# Patient Record
Sex: Male | Born: 1975 | Race: Black or African American | Hispanic: No | Marital: Single | Smoking: Never smoker
Health system: Southern US, Community
[De-identification: ages and names within clinical notes are randomized; demographics above are authoritative.]

## PROBLEM LIST (undated history)

## (undated) DIAGNOSIS — I10 Essential (primary) hypertension: Secondary | ICD-10-CM

---

## 2005-07-14 ENCOUNTER — Ambulatory Visit: Payer: Self-pay | Admitting: Internal Medicine

## 2005-07-14 ENCOUNTER — Inpatient Hospital Stay (HOSPITAL_COMMUNITY): Admission: EM | Admit: 2005-07-14 | Discharge: 2005-07-18 | Payer: Self-pay | Admitting: Emergency Medicine

## 2005-07-16 ENCOUNTER — Ambulatory Visit: Payer: Self-pay | Admitting: Internal Medicine

## 2005-08-24 ENCOUNTER — Encounter: Admission: RE | Admit: 2005-08-24 | Discharge: 2005-08-24 | Payer: Self-pay | Admitting: General Surgery

## 2006-10-19 IMAGING — CT CT PELVIS W/ CM
1 of 3 series · 14 of 32 positions shown, 19 images · IV contrast (omnipaque)
Comparison: none

CLINICAL DATA: Abdominal pain. 
 ABDOMEN CT WITH CONTRAST:
TECHNIQUE: Multidetector CT imaging of the abdomen was performed following the standard protocol during bolus administration of intravenous contrast.
 Contrast:  100 cc Omnipaque 300.
TECHNIQUE: Multidetector CT imaging of the pelvis was performed following the standard protocol during bolus administration of intravenous contrast.

[Series 4: abd/pelv with 5.0 b30f st · axial · 0.64mm/px · z∈[-471,-101]mm · 14 of 84 slices shown, 19 images]
[im 5/84  soft-tissue]
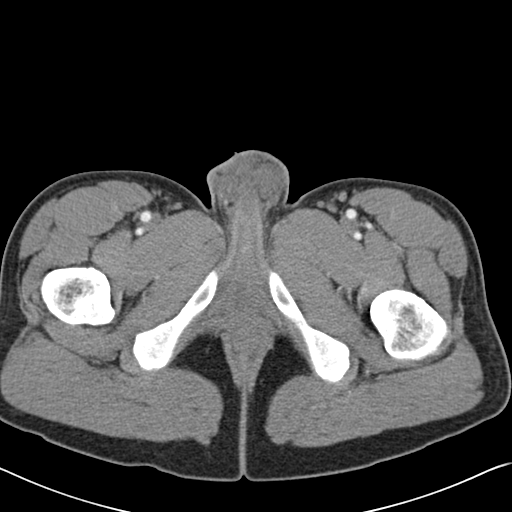
[im 5/84  bone]
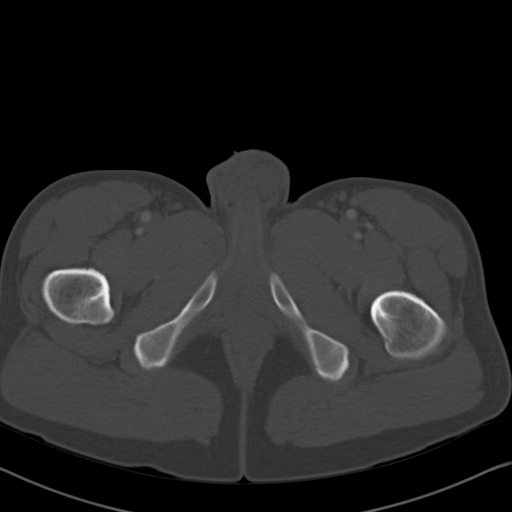
[im 14/84  soft-tissue]
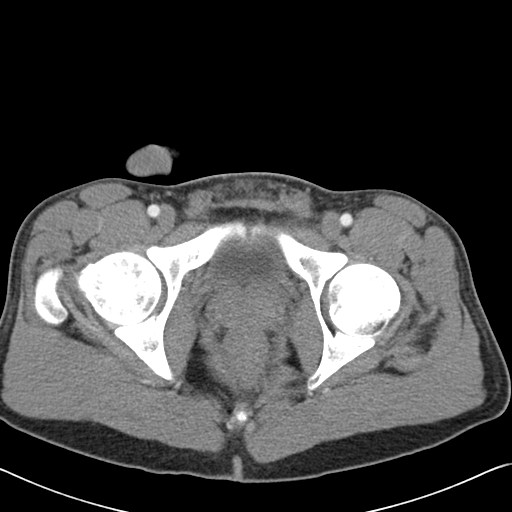
[im 18/84  soft-tissue]
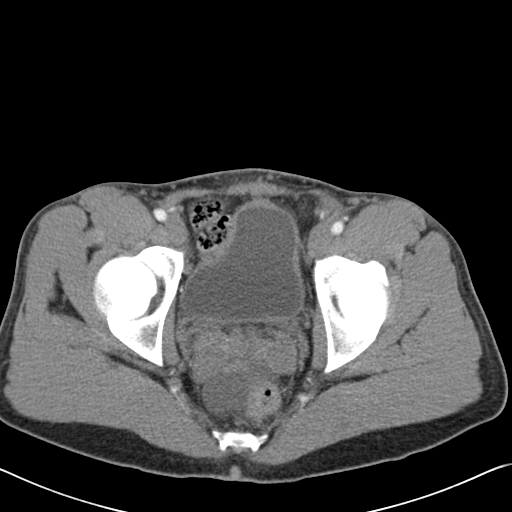
[im 22/84  soft-tissue]
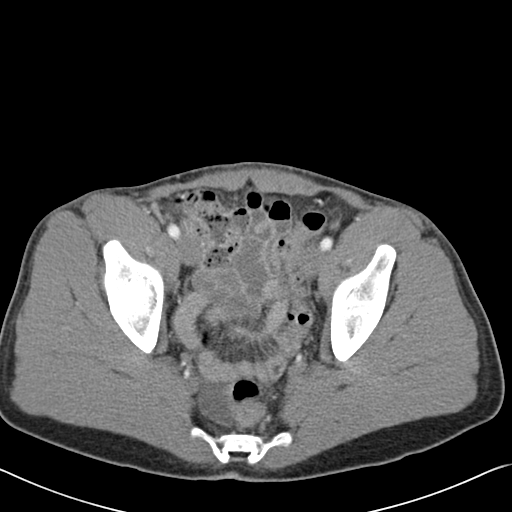
[im 31/84  soft-tissue]
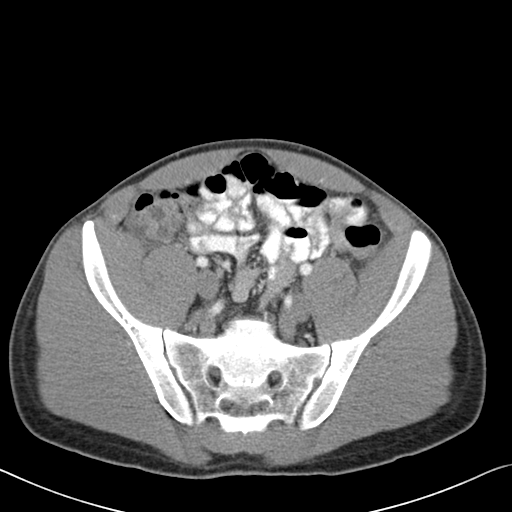
[im 35/84  soft-tissue]
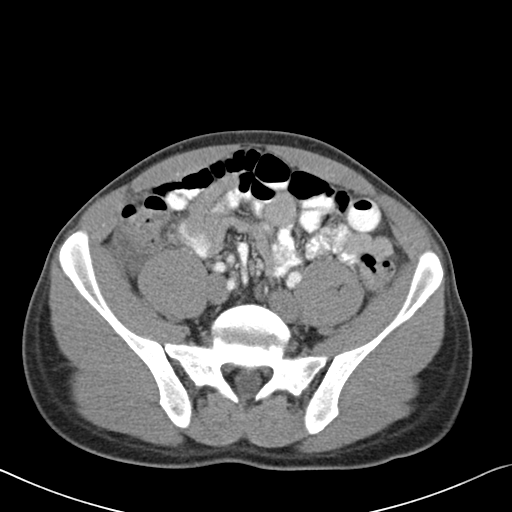
[im 44/84  soft-tissue]
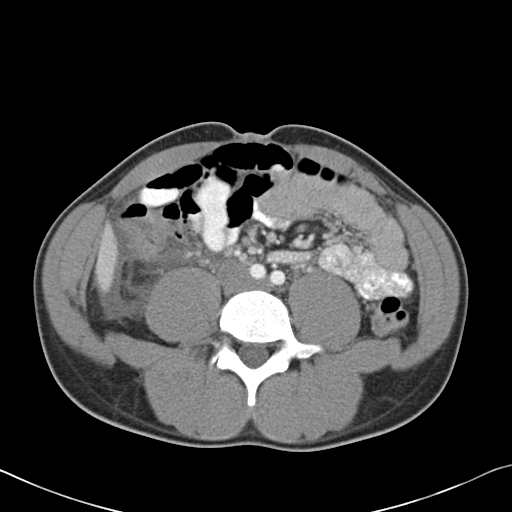
[im 49/84  soft-tissue]
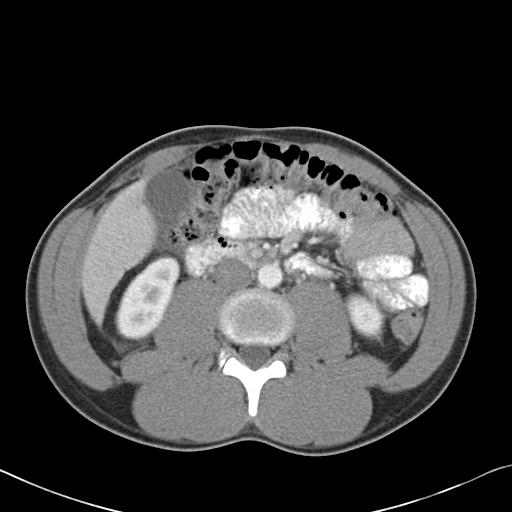
[im 53/84  soft-tissue]
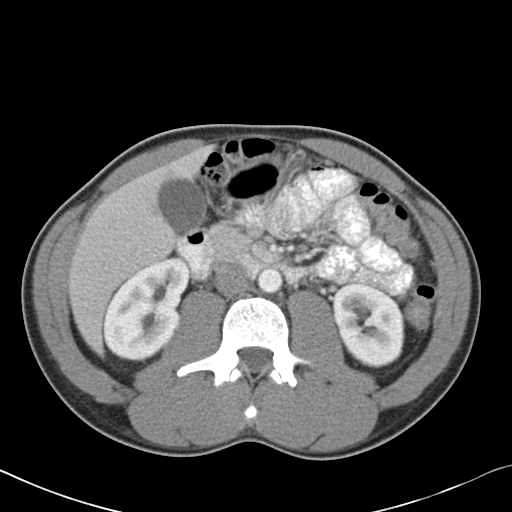
[im 53/84  bone]
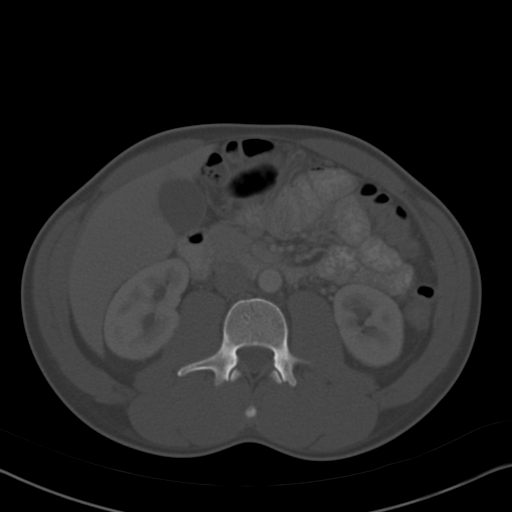
[im 62/84  soft-tissue]
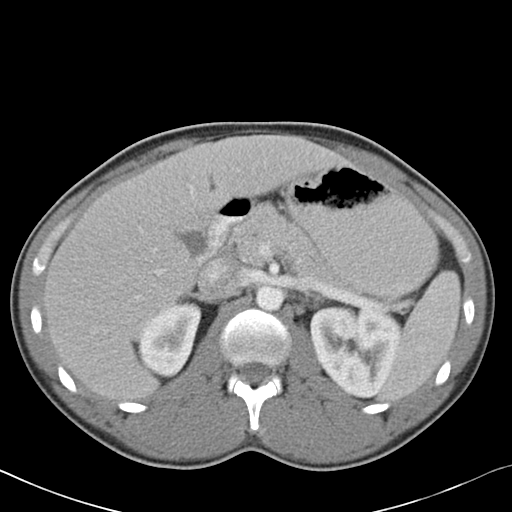
[im 66/84  soft-tissue]
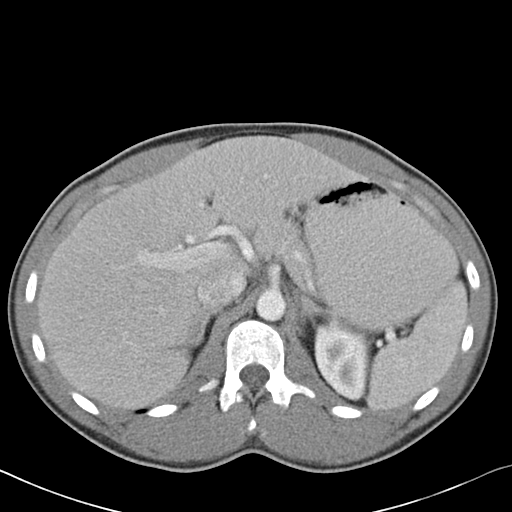
[im 66/84  lung]
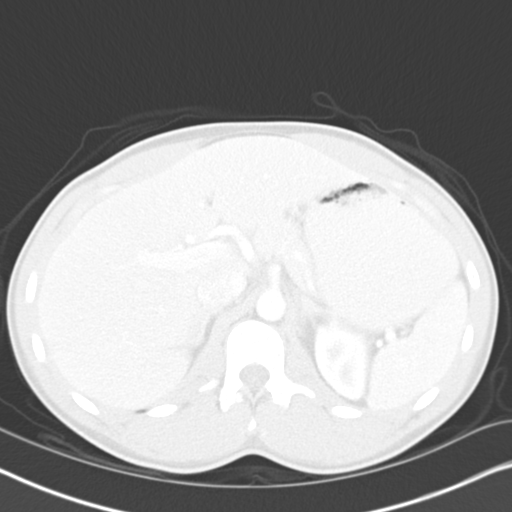
[im 70/84  soft-tissue]
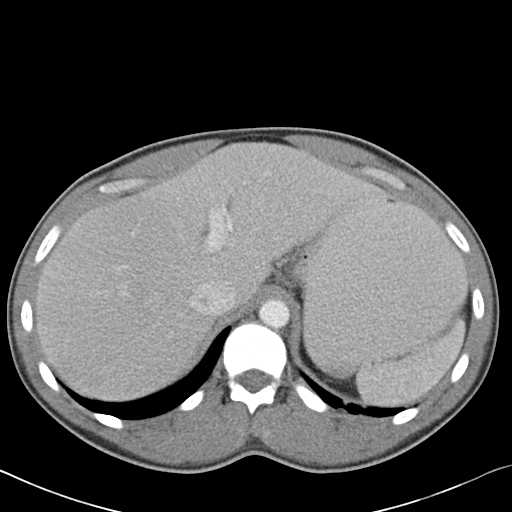
[im 70/84  lung]
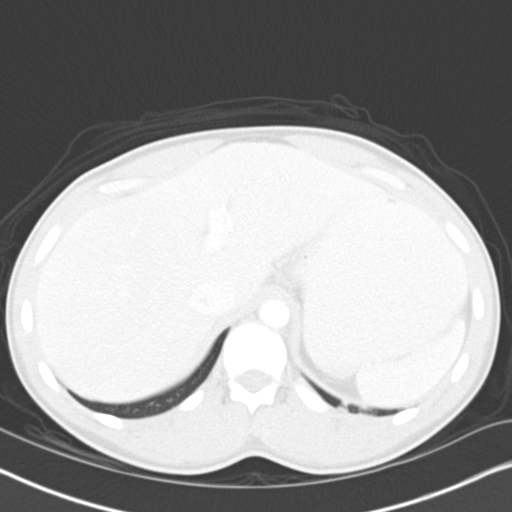
[im 75/84  lung]
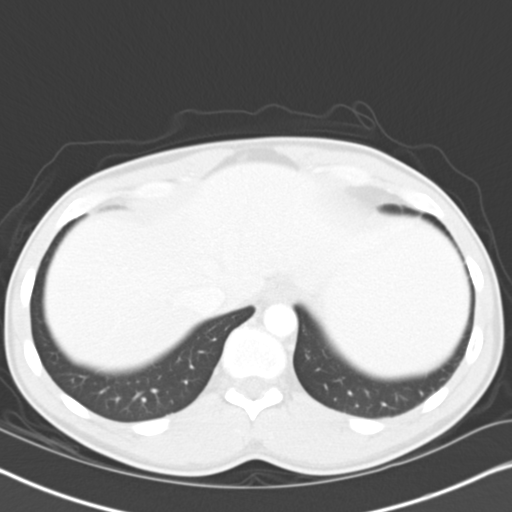
[im 79/84  soft-tissue]
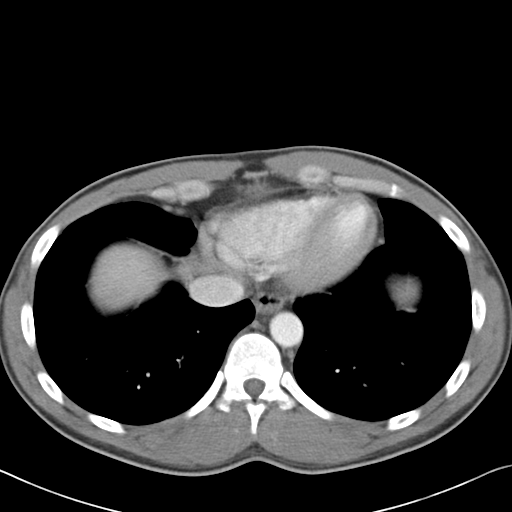
[im 79/84  lung]
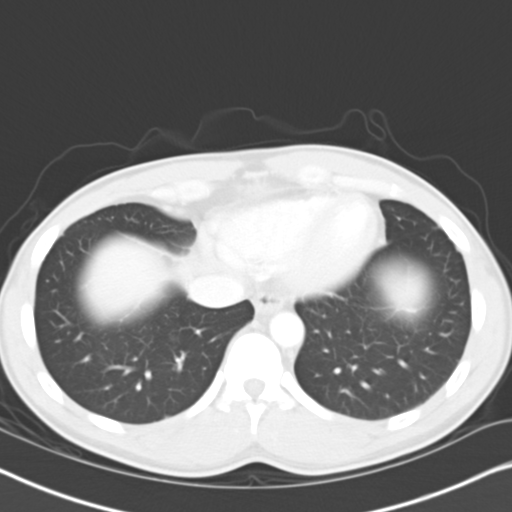

[14 of 32 positions shown; findings below may reference images not displayed]

FINDINGS: There is wall thickening and inflammatory change involving the mid ascending colon.  The cecum is in the pelvis.  The appendix cannot be defined.  There is fluid in the right paracolic gutter as well as extending around the right lobe of the liver.  There is no focal abscess.  The gallbladder, liver, kidneys, spleen, pancreas, and adrenal glands are within normal limits.  Inflammatory nodes are present in the mesentery.
IMPRESSION: There is an inflammatory process involving the ascending colon.  The appendix cannot be identified, as will be described in the pelvis dictation.  Considerations would include inflammatory process such as diverticulitis of the ascending colon or appendicitis.  In appendicitis, I would expect the inflammatory process to be closer to the cecum.  Recommendations are to continue administering oral contrast and repeating the CT scan in the a.m. or in approximately 4 to 6 hours.
 PELVIS CT WITH CONTRAST:
FINDINGS: A small amount of free fluid is seen layering in the pelvis.  The appendix cannot be identified.  The terminal ileum is within normal limits.  There is no significant inflammation of the cecum.  The prostate and bladder are within normal limits.
IMPRESSION: Free fluid in the pelvis.  The appendix cannot be identified.

## 2006-11-29 IMAGING — CT CT ABDOMEN W/ CM
1 of 2 series · 15 of 32 positions shown, 19 images · IV contrast (READY & WATER & [ID] OMNI 300)
Comparison: none

CLINICAL DATA: Colonic inflammation.  Bright red blood.  Follow-up.
 ABDOMEN CT WITH CONTRAST:
TECHNIQUE: Multidetector helical scans through the abdomen were performed after oral and IV contrast media were given.  The scan is compared to a prior CT of 07/17/05.
 Contrast: 100 cc Omnipaque 300
TECHNIQUE: Multidetector CT imaging of the pelvis was performed following the standard protocol during bolus administration of intravenous contrast.

[Series 2: a&p w/ · axial · 0.64mm/px · z∈[-388,-58]mm · 15 of 74 slices shown, 19 images]
[im 4/74  soft-tissue]
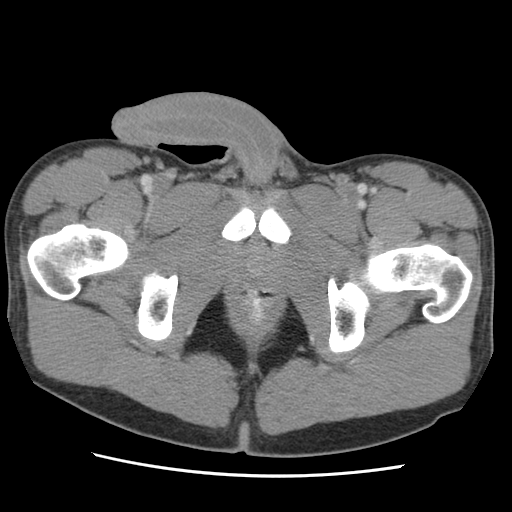
[im 4/74  bone]
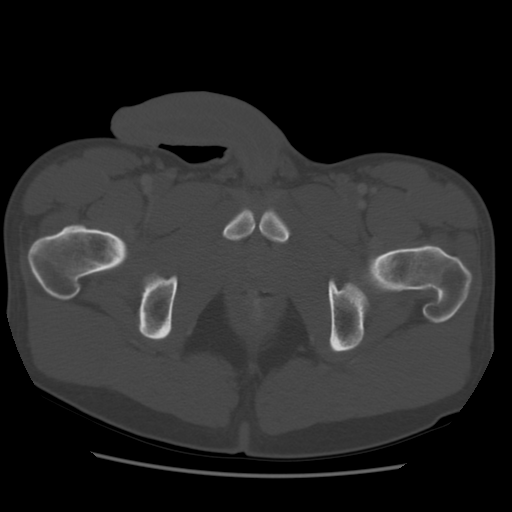
[im 10/74  soft-tissue]
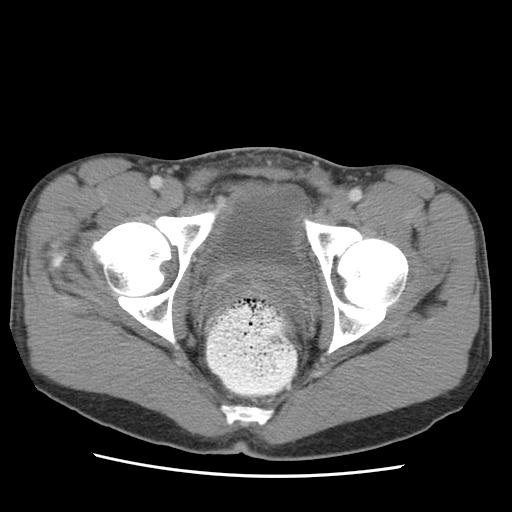
[im 17/74  soft-tissue]
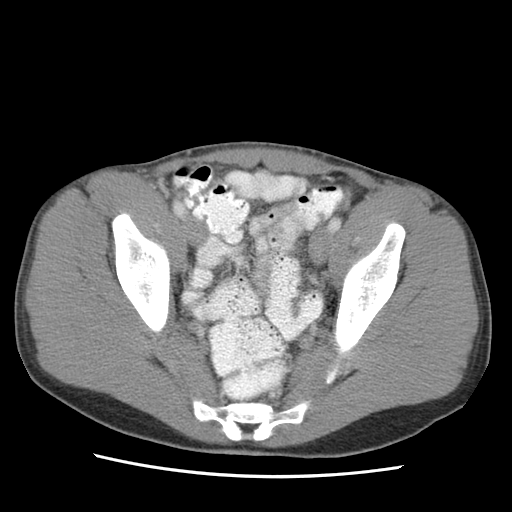
[im 20/74  soft-tissue]
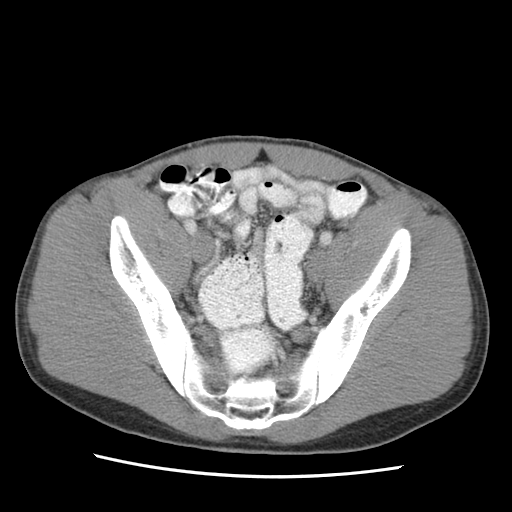
[im 27/74  soft-tissue]
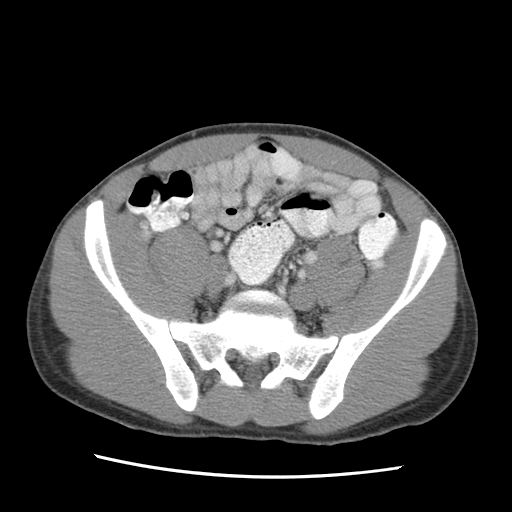
[im 30/74  soft-tissue]
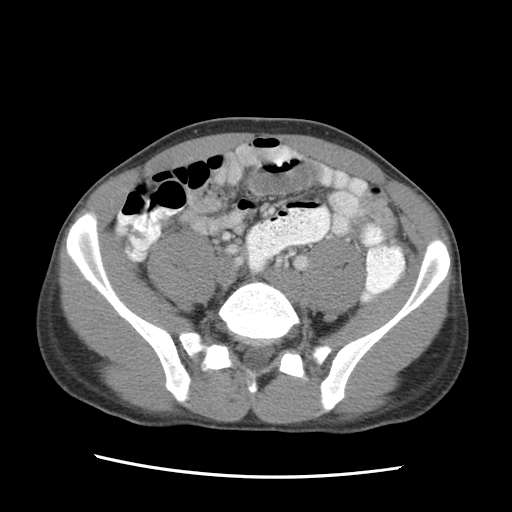
[im 37/74  soft-tissue]
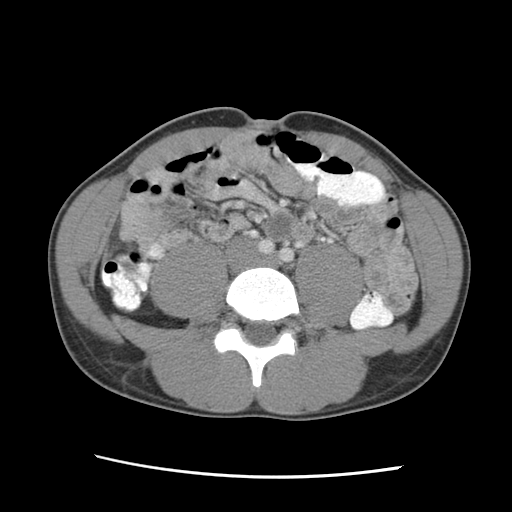
[im 44/74  soft-tissue]
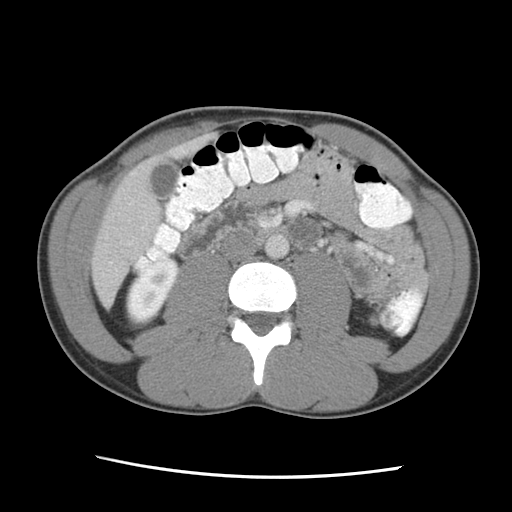
[im 47/74  soft-tissue]
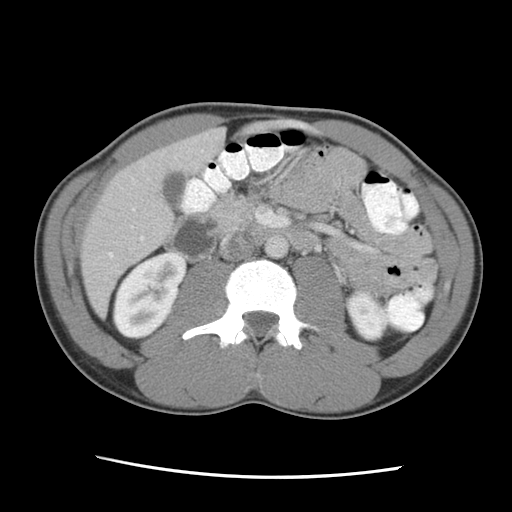
[im 47/74  bone]
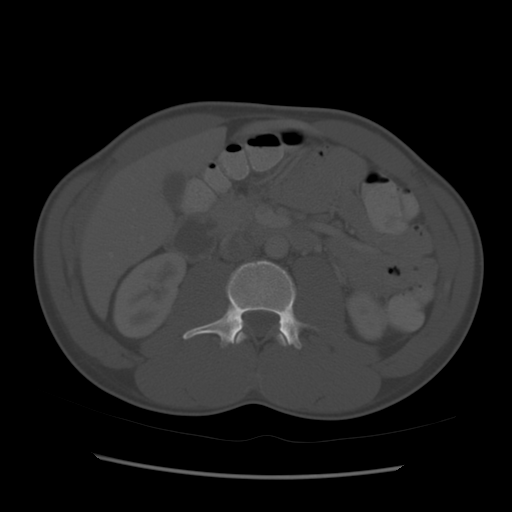
[im 54/74  soft-tissue]
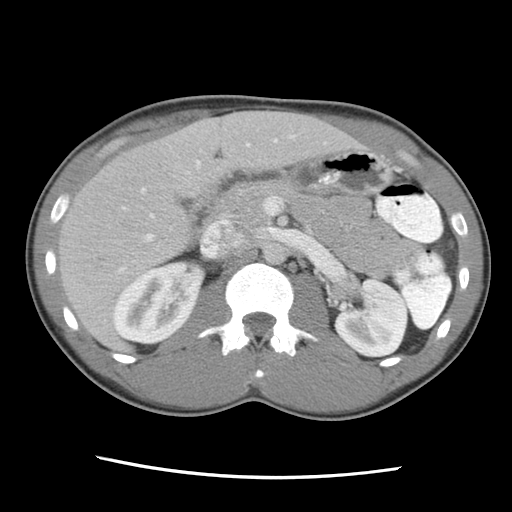
[im 57/74  soft-tissue]
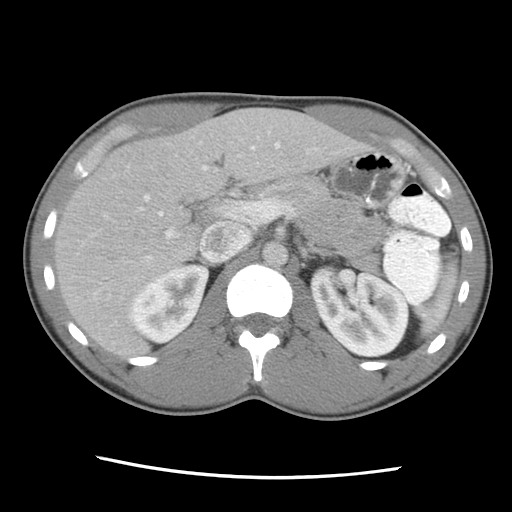
[im 60/74  lung]
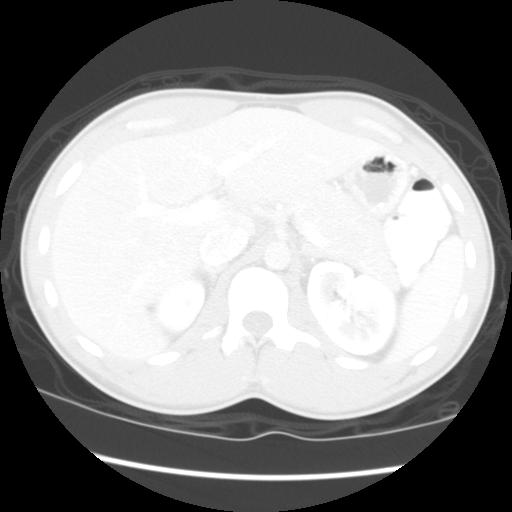
[im 64/74  soft-tissue]
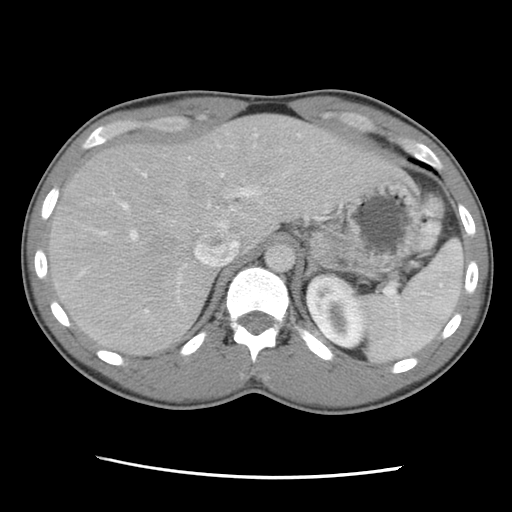
[im 64/74  lung]
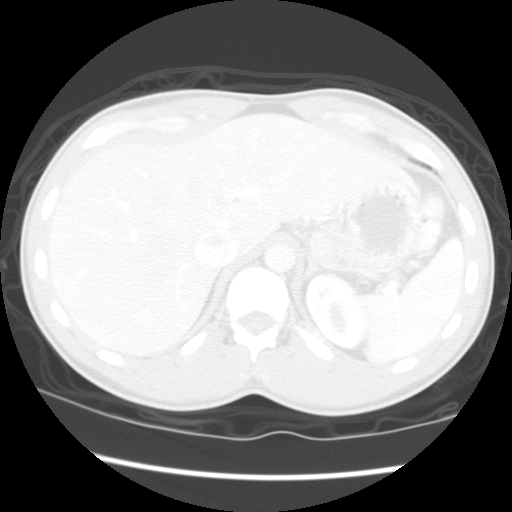
[im 67/74  lung]
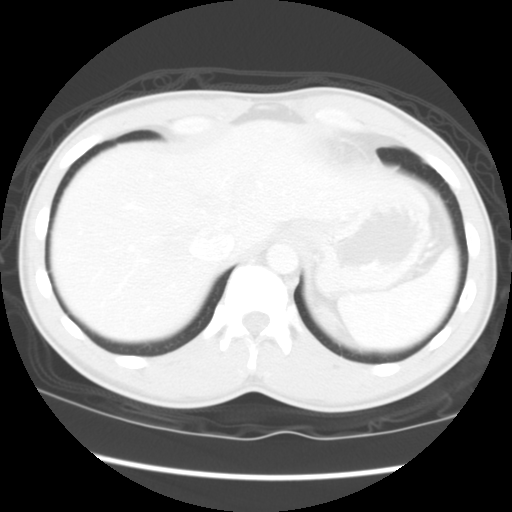
[im 70/74  soft-tissue]
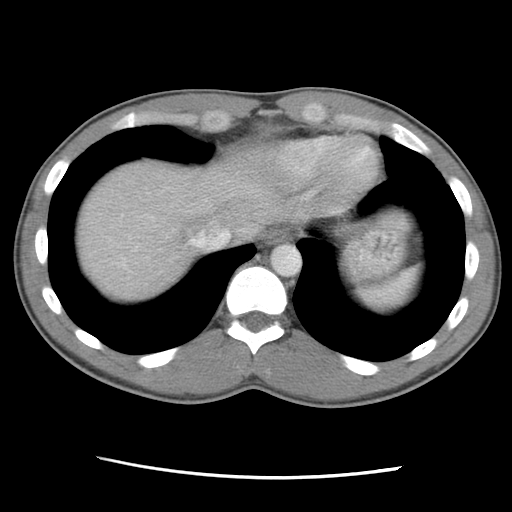
[im 70/74  lung]
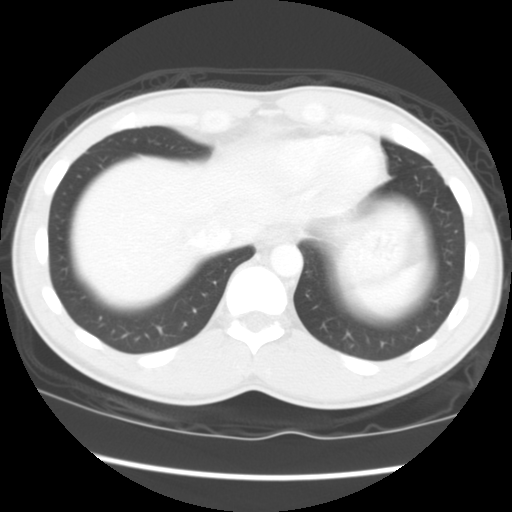

[15 of 32 positions shown; findings below may reference images not displayed]

FINDINGS: The lung bases remain clear.  The liver enhances normally with no focal abnormality and no ductal dilatation is seen.  No calcified gallstones are noted.  The pancreas remains prominent, but stable with stable peripancreatic fat planes.  The adrenal glands and spleen appear normal.  The kidneys enhance normally and on delayed images, the pelvocaliceal systems appear normal.
IMPRESSION: Stable CT of the abdomen.  No acute process. 
 PELVIS CT WITH CONTRAST:
FINDINGS: Scans were continued through the pelvis after oral and IV contrast media were given and compared to the CT of 07/17/05.  The edema of the right colon and pericolonic strandiness in the right lower quadrant has resolved.  The terminal ileum appears normal.  The appendix is also normal.  Urinary bladder is unremarkable.  Multiple cystic areas are present through the seminal vesicles, most likely normal.
IMPRESSION: The inflammatory process surrounding the right colon appears to have resolved in the interval.  No active inflammatory process is seen.

## 2015-09-15 ENCOUNTER — Encounter (HOSPITAL_COMMUNITY): Payer: Self-pay | Admitting: Emergency Medicine

## 2015-09-15 ENCOUNTER — Emergency Department (HOSPITAL_COMMUNITY)
Admission: EM | Admit: 2015-09-15 | Discharge: 2015-09-15 | Disposition: A | Discharge status: Home / Self Care | Payer: Self-pay | Attending: Emergency Medicine | Admitting: Emergency Medicine

## 2015-09-15 DIAGNOSIS — I10 Essential (primary) hypertension: Secondary | ICD-10-CM | POA: Insufficient documentation

## 2015-09-15 DIAGNOSIS — K047 Periapical abscess without sinus: Secondary | ICD-10-CM | POA: Insufficient documentation

## 2015-09-15 HISTORY — DX: Essential (primary) hypertension: I10

## 2015-09-15 LAB — I-STAT CHEM 8, ED
BUN: 11 mg/dL (ref 6–20)
CALCIUM ION: 1.1 mmol/L — AB (ref 1.12–1.23)
Chloride: 99 mmol/L — ABNORMAL LOW (ref 101–111)
Creatinine, Ser: 1.2 mg/dL (ref 0.61–1.24)
Glucose, Bld: 79 mg/dL (ref 65–99)
HEMATOCRIT: 55 % — AB (ref 39.0–52.0)
Hemoglobin: 18.7 g/dL — ABNORMAL HIGH (ref 13.0–17.0)
Potassium: 3.6 mmol/L (ref 3.5–5.1)
SODIUM: 135 mmol/L (ref 135–145)
TCO2: 22 mmol/L (ref 0–100)

## 2015-09-15 MED ORDER — AMOXICILLIN 500 MG PO CAPS: 500.0000 mg | ORAL_CAPSULE | Freq: Three times a day (TID) | ORAL | Status: AC

## 2015-09-15 MED ORDER — OXYCODONE-ACETAMINOPHEN 5-325 MG PO TABS: 1.0000 | ORAL_TABLET | Freq: Four times a day (QID) | ORAL | Status: AC | PRN

## 2015-09-15 MED ORDER — HYDROCHLOROTHIAZIDE 25 MG PO TABS: 25.0000 mg | ORAL_TABLET | Freq: Every day | ORAL | Status: AC

## 2015-09-15 MED ORDER — NAPROXEN 500 MG PO TABS: 500.0000 mg | ORAL_TABLET | Freq: Two times a day (BID) | ORAL | Status: AC

## 2015-09-15 MED ORDER — OXYCODONE-ACETAMINOPHEN 5-325 MG PO TABS
1.0000 | ORAL_TABLET | Freq: Once | ORAL | Status: AC
  Administered 2015-09-15: 1 via ORAL
  Filled 2015-09-15: qty 1

## 2015-09-15 NOTE — ED Notes (Signed)
Charge nurse called to report pt assessment.

## 2015-09-15 NOTE — Progress Notes (Signed)
[facility] [phone]  Spoke to patient regarding primary care resources and the [facility] orange card. Orange card application provided and explained, pt instructed to contact me once application is complete for an enrollment appointment. Resource guide and my contact information provided for any future questions or concerns. No other [facility] needs identified at this time.

## 2015-09-15 NOTE — ED Notes (Signed)
Dr. Karma Ganja aware of patients elevated blood pressure, patient being discharged with bp meds, advised patient is okay to be d/c'ed at this time.

## 2015-09-15 NOTE — ED Notes (Addendum)
PT's vital signs checked because triage B/P 168/123.  The blood pressure was higher at 176/128. Pt reports he has not had any blood pressure meds since he was released from jail. Pt denies any CP ,or vision changes. Marland Kitchen

## 2015-09-15 NOTE — ED Notes (Signed)
Pt reports cracked lower right molar. Pt states tooth just began hurting 2 days ago. Swelling noted to patients right lower jaw. States no dentist. Denies fever. VSS.

## 2015-09-15 NOTE — Discharge Instructions (Signed)
Return to the ED with any concerns including chest pain, shortness of breath, difficulty swallowing, swelling under the tongue, decreased level of alertness/lethargy, or any other alarming symptoms  It is very important that you call the dentist at the number provided in the next 24 hours to be able to be seen by them.  Tell them you were seen in the ED and were referred to see the dentist.

## 2015-09-15 NOTE — ED Provider Notes (Signed)
CSN: [id number]     Arrival date & time 09/15/15  1257 History   First MD Initiated Contact with Patient 09/15/15 1403     Chief Complaint  Patient presents with  . Dental Problem  . Hypertension     (Consider location/radiation/quality/duration/timing/severity/associated sxs/prior Treatment) HPI  Pt presenting with c/o right lower dental pain.  States pain began 2 days ago.  He states the tooth has been cracked for "a while".  He has pain with chewing on that side.  Also notes that the right side of his face is swollen beginning this morning. He has not taken anything for his symptoms.  He has not seen a dentist.  No difficulty swallowing or breathing.   He also is noted to be hypertensive.  He states he has a hx of hypertension but was only given medication while he was in jail- has been out for 1 year and has not had meds.  He is not sure what medication he was on.  No chest pain, no shortness of breath, no leg swelling.  There are no other associated systemic symptoms, there are no other alleviating or modifying factors.   Past Medical History  Diagnosis Date  . Hypertension    History reviewed. No pertinent past surgical history. History reviewed. No pertinent family history. Social History  Substance Use Topics  . Smoking status: Never Smoker   . Smokeless tobacco: None  . Alcohol Use: Yes     Comment: occasional    Review of Systems  ROS reviewed and all otherwise negative except for mentioned in HPI    Allergies  Review of patient's allergies indicates no known allergies.  Home Medications   Prior to Admission medications   Medication Sig Start Date End Date Taking? Authorizing Provider  amoxicillin (AMOXIL) 500 MG capsule Take 1 capsule (500 mg total) by mouth 3 (three) times daily. 09/15/15   Jerelyn Scott, MD  hydrochlorothiazide (HYDRODIURIL) 25 MG tablet Take 1 tablet (25 mg total) by mouth daily. 09/15/15   Jerelyn Scott, MD  naproxen (NAPROSYN) 500 MG tablet  Take 1 tablet (500 mg total) by mouth 2 (two) times daily. 09/15/15   Jerelyn Scott, MD  oxyCODONE-acetaminophen (PERCOCET/ROXICET) 5-325 MG tablet Take 1-2 tablets by mouth every 6 (six) hours as needed for severe pain. 09/15/15   Jerelyn Scott, MD   BP 178/139 mmHg  Pulse 84  Temp(Src) 99 F (37.2 C) (Oral)  Resp 18  Ht  (1.753 m)  Wt 155 lb (70.308 kg)  BMI 22.88 kg/m2  SpO2 99%  Vitals reviewed Physical Exam  Physical Examination: General appearance - alert, well appearing, and in no distress Mental status - alert, oriented to person, place, and time Eyes - no conjunctival injection, no scleral icterus Mouth - mucous membranes moist, pharynx normal without lesions, diffuse dental disease, ttp over right lower molar, some gingival erythema but no definitive gingival abscess Chest - clear to auscultation, no wheezes, rales or rhonchi, symmetric air entry Heart - normal rate, regular rhythm, normal S1, S2, no murmurs, rubs, clicks or gallops Neurological - alert, oriented x 3 Extremities - peripheral pulses normal, no pedal edema, no clubbing or cyanosis Skin - normal coloration and turgor, no rashes  ED Course  Procedures (including critical care time) Labs Review Labs Reviewed  I-STAT CHEM 8, ED - Abnormal; Notable for the following:    Chloride 99 (*)    Calcium, Ion 1.10 (*)    Hemoglobin 18.7 (*)    HCT  55.0 (*)    All other components within normal limits    Imaging Review No results found. I have personally reviewed and evaluated these images and lab results as part of my medical decision-making.   EKG Interpretation   Date/Time:  Thursday September 15 2015 15:03:23 EDT Ventricular Rate:  65 PR Interval:  134 QRS Duration: 86 QT Interval:  442 QTC Calculation: 460 R Axis:   90 Text Interpretation:  Sinus rhythm Borderline right axis deviation  Consider left ventricular hypertrophy No old tracing to compare Confirmed  by [facility]  MD, Cain Fitzhenry [phone]) on  09/15/2015 3:06:55 PM      MDM   Final diagnoses:  Dental abscess    Pt presenting with c/o dental pain , also found to be hypertensive.  Pt given abx and pain medication for dental pain, given referral to dentist.  Also found to be hypertensive.  Does have some evidence of LVH on EKG, otherwise no evidence of acute end organ damage.  Pt does not know what BP meds he has taken in the past, will start on rx for hydrochlorothiazide.  disucssed the importance of getting PMD.  Given referral to wellness center.     Jerelyn Scott, MD 09/16/15 [phone]
